# Patient Record
Sex: Female | Born: 1995
Health system: Southern US, Community
[De-identification: ages and names within clinical notes are randomized; demographics above are authoritative.]

## PROBLEM LIST (undated history)

## (undated) DIAGNOSIS — K6389 Other specified diseases of intestine: Secondary | ICD-10-CM

## (undated) DIAGNOSIS — M1990 Unspecified osteoarthritis, unspecified site: Secondary | ICD-10-CM

## (undated) HISTORY — PX: EYE MUSCLE SURGERY: SHX370

## (undated) HISTORY — PX: WISDOM TOOTH EXTRACTION: SHX21

---
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
## 2017-01-11 DIAGNOSIS — H5203 Hypermetropia, bilateral: Secondary | ICD-10-CM | POA: Insufficient documentation

## 2017-01-11 DIAGNOSIS — H52223 Regular astigmatism, bilateral: Secondary | ICD-10-CM

## 2017-01-26 DIAGNOSIS — H5043 Accommodative component in esotropia: Secondary | ICD-10-CM | POA: Diagnosis not present

## 2017-04-20 DIAGNOSIS — L700 Acne vulgaris: Secondary | ICD-10-CM | POA: Diagnosis not present

## 2018-01-06 DIAGNOSIS — R1084 Generalized abdominal pain: Secondary | ICD-10-CM | POA: Diagnosis not present

## 2018-01-06 DIAGNOSIS — A084 Viral intestinal infection, unspecified: Secondary | ICD-10-CM | POA: Diagnosis not present

## 2018-01-06 DIAGNOSIS — R112 Nausea with vomiting, unspecified: Secondary | ICD-10-CM | POA: Diagnosis not present

## 2018-07-25 DIAGNOSIS — H5203 Hypermetropia, bilateral: Secondary | ICD-10-CM | POA: Diagnosis not present

## 2018-10-12 ENCOUNTER — Ambulatory Visit (INDEPENDENT_AMBULATORY_CARE_PROVIDER_SITE_OTHER): Payer: Self-pay | Admitting: Family Medicine

## 2018-10-14 ENCOUNTER — Other Ambulatory Visit (INDEPENDENT_AMBULATORY_CARE_PROVIDER_SITE_OTHER): Payer: Self-pay

## 2018-10-14 ENCOUNTER — Other Ambulatory Visit (INDEPENDENT_AMBULATORY_CARE_PROVIDER_SITE_OTHER): Payer: Self-pay | Admitting: Family Medicine

## 2018-10-14 DIAGNOSIS — M25552 Pain in left hip: Secondary | ICD-10-CM

## 2018-10-14 MED ORDER — LORAZEPAM 0.5 MG PO TABS: 0.2500 mg | ORAL_TABLET | Freq: Every evening | ORAL | 0 refills | Status: DC | PRN

## 2018-10-14 MED ORDER — ACETAMINOPHEN-CODEINE #3 300-30 MG PO TABS: 1.0000 | ORAL_TABLET | ORAL | 0 refills | Status: DC | PRN

## 2018-10-15 ENCOUNTER — Encounter (INDEPENDENT_AMBULATORY_CARE_PROVIDER_SITE_OTHER): Payer: Self-pay | Admitting: Family Medicine

## 2018-10-15 NOTE — Progress Notes (Signed)
   Office Visit Note   Patient: Natalie Maldonado           Date of Birth: [birth year]           MRN: [id number] Visit Date: 10/12/2018 Requested by: Aggie Hacker, MD [address] [place], [place] [address] PCP: Aggie Hacker, MD  Subjective: No chief complaint on file.   HPI: She is here with left hip pain.  She has a history of inflammatory arthritic troubles a few years ago.  She briefly was treated with methotrexate.  She did well since then.  Recent flare-up of left groin/hip pain.  No fevers or chills.  Had flu shot about a month ago.  No unusual dietary changes.              ROS: Otherwise noncontributory.  Objective: Vital Signs: There were no vitals taken for this visit.  Physical Exam:  Walks with a limp.  Extreme pain with passive internal left hip rotation.    Imaging: Ultrasound shows a joint effusion with no hyperemia on power doppler.  Assessment & Plan: 1.  Left hip effusion - We will aspirate and analyze fluid. - Inject with small amount of steroid.   Follow-Up Instructions: No follow-ups on file.       Procedures: Ultrasound-guided left hip aspiration/injection:  After sterile prep, injected 5 cc marcaine superficially and toward the capsule but not into the joint, then aspirated 8 cc hazy yellow fluid using 18 gauge spinal needle, then injected 5 cc 1 % lidocaine intra-articularly.    PMFS History: Patient Active Problem List   Diagnosis Date Noted  . Granuloma of intestine 03/27/2015  . Inflammatory arthritis   . FUO (fever of unknown origin)   . Postoperative fever   . Perforated appendicitis 03/23/2015   Past Medical History:  Diagnosis Date  . Arthritis    joint swelling as a result of appenditis  . Granuloma of intestine 03/27/2015    No family history on file.  Past Surgical History:  Procedure Laterality Date  . EYE MUSCLE SURGERY     age 82  . HYSTEROSCOPY N/A 06/01/2016   Procedure: EXCISION OF HYMEN;  Surgeon: Fermin Schwab, MD;   Location: [facility];  Service: Gynecology;  Laterality: N/A;  . LAPAROSCOPIC APPENDECTOMY N/A 03/24/2015   Procedure: APPENDECTOMY LAPAROSCOPIC;  Surgeon: Abigail Miyamoto, MD;  Location: [facility] OR;  Service: General;  Laterality: N/A;  . WISDOM TOOTH EXTRACTION     Social History   Occupational History  . Not on file  Tobacco Use  . Smoking status: Never Smoker  Substance and Sexual Activity  . Alcohol use: Not on file  . Drug use: Not on file  . Sexual activity: Not on file

## 2018-10-18 DIAGNOSIS — H509 Unspecified strabismus: Secondary | ICD-10-CM | POA: Insufficient documentation

## 2018-10-18 DIAGNOSIS — Z0000 Encounter for general adult medical examination without abnormal findings: Secondary | ICD-10-CM | POA: Insufficient documentation

## 2018-10-19 DIAGNOSIS — K6389 Other specified diseases of intestine: Secondary | ICD-10-CM | POA: Diagnosis not present

## 2018-10-19 DIAGNOSIS — M1990 Unspecified osteoarthritis, unspecified site: Secondary | ICD-10-CM | POA: Diagnosis not present

## 2018-10-19 DIAGNOSIS — Z8719 Personal history of other diseases of the digestive system: Secondary | ICD-10-CM | POA: Diagnosis not present

## 2018-10-19 DIAGNOSIS — H509 Unspecified strabismus: Secondary | ICD-10-CM | POA: Diagnosis not present

## 2018-10-19 DIAGNOSIS — Z0000 Encounter for general adult medical examination without abnormal findings: Secondary | ICD-10-CM | POA: Diagnosis not present

## 2018-10-19 LAB — ANAEROBIC AND AEROBIC CULTURE
AER RESULT:: NO GROWTH
GRAM STAIN: NONE SEEN
MICRO NUMBER:: 91322865
MICRO NUMBER:: 91322866
SPECIMEN QUALITY: ADEQUATE
SPECIMEN QUALITY:: ADEQUATE

## 2018-10-19 LAB — SYNOVIAL CELL COUNT + DIFF, W/ CRYSTALS
Basophils, %: 0 %
EOSINOPHILS-SYNOVIAL: 0 % (ref 0–2)
Lymphocytes-Synovial Fld: 5 % (ref 0–74)
Monocyte/Macrophage: 28 % (ref 0–69)
NEUTROPHIL, SYNOVIAL: 67 % — AB (ref 0–24)
Synoviocytes, %: 0 % (ref 0–15)
WBC, SYNOVIAL: 13130 {cells}/uL — AB (ref <150)

## 2018-11-27 ENCOUNTER — Ambulatory Visit (INDEPENDENT_AMBULATORY_CARE_PROVIDER_SITE_OTHER): Payer: 59 | Admitting: Family Medicine

## 2018-11-27 ENCOUNTER — Encounter (INDEPENDENT_AMBULATORY_CARE_PROVIDER_SITE_OTHER): Payer: Self-pay | Admitting: Family Medicine

## 2018-11-27 DIAGNOSIS — M25552 Pain in left hip: Secondary | ICD-10-CM

## 2018-11-27 NOTE — Patient Instructions (Addendum)
   [email or web address]  [email or web address]

## 2018-11-27 NOTE — Progress Notes (Signed)
   Office Visit Note   Patient: Natalie Maldonado           Date of Birth: June 18, 1996           MRN: [id number] Visit Date: 11/27/2018 Requested by: Aggie HackerSumner, Brian, MD [address] [place], [place] [id number] PCP: Aggie HackerSumner, Brian, MD  Subjective: Chief Complaint  Patient presents with  . Left Hip - Pain    HPI: She is here with recurrent left hip pain.  Symptoms started a couple days ago, possibly after eating a meal at a restaurant.  She has been experimenting with an autoimmune protocol diet and thinks that it might be helping.  She is not sure what food might have triggered her current episode.  Symptoms are much better after taking Aleve.  She is getting ready to have some specialty labs drawn by a nutritionist tomorrow.  She wonders what else she might consider getting checked.              ROS: Noncontributory  Objective: Vital Signs: There were no vitals taken for this visit.  Physical Exam:  Left hip: Much better range of motion today with minimal pain on internal rotation.  Imaging: MSK-US: No significant joint effusion today in either hip.  No hyperemia on power Doppler imaging.  Assessment & Plan: 1.  Improved left hip pain status post recent flareup -We will add vitamin D level and TPO antibodies to her labs for tomorrow. -She also wants to investigate bio compatibility metal testing.  She plans to have food sensitivity testing tomorrow. -We will order MRI of the pelvis as well.   Follow-Up Instructions: No follow-ups on file.      Procedures: No procedures performed  No notes on file    PMFS History: Patient Active Problem List   Diagnosis Date Noted  . Granuloma of intestine 03/27/2015  . Inflammatory arthritis   . FUO (fever of unknown origin)   . Postoperative fever   . Perforated appendicitis 03/23/2015   Past Medical History:  Diagnosis Date  . Arthritis    joint swelling as a result of appenditis  . Granuloma of intestine 03/27/2015    History reviewed.  No pertinent family history.  Past Surgical History:  Procedure Laterality Date  . EYE MUSCLE SURGERY     age 27  . HYSTEROSCOPY N/A 06/01/2016   Procedure: EXCISION OF HYMEN;  Surgeon: Fermin Schwabamer Yalcinkaya, MD;  Location: [facility];  Service: Gynecology;  Laterality: N/A;  . LAPAROSCOPIC APPENDECTOMY N/A 03/24/2015   Procedure: APPENDECTOMY LAPAROSCOPIC;  Surgeon: Abigail Miyamotoouglas [name], MD;  Location: [facility] OR;  Service: General;  Laterality: N/A;  . WISDOM TOOTH EXTRACTION     Social History   Occupational History  . Not on file  Tobacco Use  . Smoking status: Never Smoker  Substance and Sexual Activity  . Alcohol use: Not on file  . Drug use: Not on file  . Sexual activity: Not on file

## 2018-12-07 ENCOUNTER — Ambulatory Visit
Admission: RE | Admit: 2018-12-07 | Discharge: 2018-12-07 | Disposition: A | Discharge status: Home / Self Care | Payer: 59 | Source: Ambulatory Visit | Attending: Family Medicine | Admitting: Family Medicine

## 2018-12-07 DIAGNOSIS — M25552 Pain in left hip: Secondary | ICD-10-CM

## 2018-12-07 DIAGNOSIS — S73192A Other sprain of left hip, initial encounter: Secondary | ICD-10-CM | POA: Diagnosis not present

## 2018-12-10 ENCOUNTER — Telehealth (INDEPENDENT_AMBULATORY_CARE_PROVIDER_SITE_OTHER): Payer: Self-pay | Admitting: Family Medicine

## 2018-12-10 NOTE — Telephone Encounter (Signed)
MRI shows acetabular labrum tear.

## 2018-12-14 ENCOUNTER — Other Ambulatory Visit (INDEPENDENT_AMBULATORY_CARE_PROVIDER_SITE_OTHER): Payer: Self-pay | Admitting: Family Medicine

## 2018-12-14 ENCOUNTER — Telehealth (INDEPENDENT_AMBULATORY_CARE_PROVIDER_SITE_OTHER): Payer: Self-pay | Admitting: Family Medicine

## 2018-12-14 ENCOUNTER — Ambulatory Visit (INDEPENDENT_AMBULATORY_CARE_PROVIDER_SITE_OTHER): Payer: Self-pay

## 2018-12-14 ENCOUNTER — Ambulatory Visit (INDEPENDENT_AMBULATORY_CARE_PROVIDER_SITE_OTHER): Payer: 59

## 2018-12-14 DIAGNOSIS — R768 Other specified abnormal immunological findings in serum: Secondary | ICD-10-CM

## 2018-12-14 NOTE — Telephone Encounter (Signed)
CXR looks good, no adenopathy.

## 2018-12-14 NOTE — Progress Notes (Signed)
The patient comes in today for chest xray - reviewed by Dr. Prince Rome.

## 2019-01-22 DIAGNOSIS — H01004 Unspecified blepharitis left upper eyelid: Secondary | ICD-10-CM | POA: Diagnosis not present

## 2019-01-22 DIAGNOSIS — H5789 Other specified disorders of eye and adnexa: Secondary | ICD-10-CM | POA: Diagnosis not present

## 2019-01-22 DIAGNOSIS — H538 Other visual disturbances: Secondary | ICD-10-CM | POA: Diagnosis not present

## 2019-03-01 ENCOUNTER — Other Ambulatory Visit (INDEPENDENT_AMBULATORY_CARE_PROVIDER_SITE_OTHER): Payer: Self-pay | Admitting: Family Medicine

## 2019-03-01 MED ORDER — METHYLPREDNISOLONE 4 MG PO TBPK
ORAL_TABLET | ORAL | 0 refills | Status: DC
Start: 2019-03-01 — End: 2019-03-01

## 2019-03-01 MED ORDER — METHYLPREDNISOLONE 4 MG PO TBPK: ORAL_TABLET | ORAL | 0 refills | Status: DC

## 2019-03-01 NOTE — Progress Notes (Signed)
.  ed

## 2019-04-19 ENCOUNTER — Other Ambulatory Visit: Payer: Self-pay

## 2019-04-19 ENCOUNTER — Ambulatory Visit: Payer: 59 | Admitting: Family Medicine

## 2019-04-19 ENCOUNTER — Encounter: Payer: Self-pay | Admitting: Family Medicine

## 2019-04-19 DIAGNOSIS — R5383 Other fatigue: Secondary | ICD-10-CM | POA: Diagnosis not present

## 2019-04-19 NOTE — Progress Notes (Signed)
Office Visit Note   Patient: Natalie Maldonado           Date of Birth: 25-Feb-1996           MRN: [id number] Visit Date: 04/19/2019 Requested by: Aggie HackerSumner, Brian, MD [address] [place], [place] [id number] PCP: Aggie HackerSumner, Brian, MD  Subjective: Chief Complaint  Patient presents with  . thyroid check    HPI: She is here with fatigue.  For the past couple months she has been intermittently excessively tired.  She sleeps well and gets enough sleep, but despite that she has some days where she is very sluggish and "foggy".  She has also noticed that she is losing hair more readily lately.  Denies any change in her weight, no bowel or bladder change, she chronically has abnormal menstrual cycles.  Denies any brittle nails or unusually dry skin.  No definite family history of thyroid dysfunction.  She continues to have intermittent pain in her hip.               ROS:  All other systems were reviewed and are negative.  Objective: Vital Signs: There were no vitals taken for this visit.  Physical Exam:  General:  Alert and oriented, in no acute distress. Pulm:  Breathing unlabored. Psy:  Normal mood, congruent affect. Skin: No visible rash today. HEENT:  Schlusser/AT, PERRLA, EOM Full, no nystagmus.  Funduscopic examination within normal limits.  No conjunctival erythema.  Tympanic membranes are pearly gray with normal landmarks.  External ear canals are normal.  Nasal passages are clear.  Oropharynx is clear.  No significant lymphadenopathy.  No thyromegaly or nodules.  2+ carotid pulses without bruits. Extremities: 2+ radial and posterior tibial pulses.  2+ upper and lower extremity DTRs.  No nail deformities.   Imaging: None today.  Assessment & Plan: 1.  Fatigue and excess hair loss, etiology uncertain.  Cannot rule out thyroid dysfunction. -We will draw thyroid labs including antibodies.     Procedures: No procedures performed  No notes on file     PMFS History: Patient Active Problem List    Diagnosis Date Noted  . Annual physical exam 10/18/2018  . Strabismus 10/18/2018  . Hyperopia of both eyes with regular astigmatism 01/11/2017  . Accommodative esotropia 11/30/2015  . Hypertropia of left eye 11/30/2015  . Intermittent alternating exotropia 11/30/2015  . Other chronic allergic conjunctivitis 11/30/2015  . Status post eye surgery 11/30/2015  . 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 History of appendicitis 05/25/2015  . Granuloma of intestine 03/27/2015  . Inflammatory arthritis   . FUO (fever of unknown origin)   . Postoperative fever   . Perforated appendicitis 03/23/2015  . Transient arthropathy 02/09/2015   Past Medical History:  Diagnosis Date  . Arthritis    joint swelling as a result of appenditis  . Granuloma of intestine 03/27/2015    History reviewed. No pertinent family history.  Past Surgical History:  Procedure Laterality Date  . EYE MUSCLE SURGERY     age 32  . HYSTEROSCOPY N/A 06/01/2016   Procedure: EXCISION OF HYMEN;  Surgeon: Fermin Schwabamer Yalcinkaya, MD;  Location: [facility];  Service: Gynecology;  Laterality: N/A;  . LAPAROSCOPIC APPENDECTOMY N/A 03/24/2015   Procedure: APPENDECTOMY LAPAROSCOPIC;  Surgeon: Abigail Miyamotoouglas [name], MD;  Location: [facility];  Service: General;  Laterality: N/A;  . WISDOM TOOTH EXTRACTION     Social History   Occupational History  . Not on file  Tobacco Use  . Smoking status: Never  Smoker  Substance and Sexual Activity  . Alcohol use: Not on file  . Drug use: Not on file  . Sexual activity: Not on file

## 2019-04-20 ENCOUNTER — Telehealth: Payer: Self-pay | Admitting: Family Medicine

## 2019-04-20 NOTE — Telephone Encounter (Signed)
TSH in normal range at 3.69, but higher than I'd like to see at her age (target would be 0.5-1.0).    Antibodies still pending.

## 2019-04-22 LAB — THYROID PEROXIDASE ANTIBODY: Thyroperoxidase Ab SerPl-aCnc: 1 IU/mL (ref <9)

## 2019-04-22 LAB — THYROID PANEL WITH TSH
Free Thyroxine Index: 2.1 (ref 1.4–3.8)
T3 Uptake: 28 % (ref 22–35)
T4, Total: 7.5 ug/dL (ref 5.1–11.9)
TSH: 3.69 mIU/L

## 2019-04-24 DIAGNOSIS — M2550 Pain in unspecified joint: Secondary | ICD-10-CM | POA: Diagnosis not present

## 2019-04-24 DIAGNOSIS — E663 Overweight: Secondary | ICD-10-CM | POA: Diagnosis not present

## 2019-04-24 DIAGNOSIS — Z6825 Body mass index (BMI) 25.0-25.9, adult: Secondary | ICD-10-CM | POA: Diagnosis not present

## 2019-05-02 DIAGNOSIS — M2550 Pain in unspecified joint: Secondary | ICD-10-CM | POA: Diagnosis not present

## 2019-05-03 MED FILL — sulfaSALAzine 500 MG TABS: 500 | 30 days supply | Qty: 60 | Fill #0

## 2019-05-14 DIAGNOSIS — L568 Other specified acute skin changes due to ultraviolet radiation: Secondary | ICD-10-CM | POA: Diagnosis not present

## 2019-05-16 MED FILL — PIMECROLIMUS 1 % CREA: 1 | 30 days supply | Qty: 30 | Fill #0

## 2019-05-22 DIAGNOSIS — L560 Drug phototoxic response: Secondary | ICD-10-CM | POA: Diagnosis not present

## 2019-05-22 DIAGNOSIS — B078 Other viral warts: Secondary | ICD-10-CM | POA: Diagnosis not present

## 2019-05-24 DIAGNOSIS — M0800 Unspecified juvenile rheumatoid arthritis of unspecified site: Secondary | ICD-10-CM | POA: Diagnosis not present

## 2019-05-24 DIAGNOSIS — H5203 Hypermetropia, bilateral: Secondary | ICD-10-CM | POA: Diagnosis not present

## 2019-05-24 DIAGNOSIS — H538 Other visual disturbances: Secondary | ICD-10-CM | POA: Diagnosis not present

## 2019-05-29 DIAGNOSIS — Z01419 Encounter for gynecological examination (general) (routine) without abnormal findings: Secondary | ICD-10-CM | POA: Diagnosis not present

## 2019-05-29 DIAGNOSIS — Z6824 Body mass index (BMI) 24.0-24.9, adult: Secondary | ICD-10-CM | POA: Diagnosis not present

## 2019-06-03 DIAGNOSIS — M2550 Pain in unspecified joint: Secondary | ICD-10-CM | POA: Diagnosis not present

## 2019-06-03 DIAGNOSIS — Z6824 Body mass index (BMI) 24.0-24.9, adult: Secondary | ICD-10-CM | POA: Diagnosis not present

## 2019-06-03 MED FILL — predniSONE 5 MG TABS: 5 | 12 days supply | Qty: 48 | Fill #0

## 2019-06-03 MED FILL — sulfaSALAzine 500 MG TABS: 500 | 30 days supply | Qty: 60 | Fill #1

## 2019-07-08 MED FILL — sulfaSALAzine 500 MG TABS: 500 | 30 days supply | Qty: 60 | Fill #2

## 2019-07-18 DIAGNOSIS — N946 Dysmenorrhea, unspecified: Secondary | ICD-10-CM | POA: Diagnosis not present

## 2019-07-18 DIAGNOSIS — Z30011 Encounter for initial prescription of contraceptive pills: Secondary | ICD-10-CM | POA: Diagnosis not present

## 2019-07-18 DIAGNOSIS — N920 Excessive and frequent menstruation with regular cycle: Secondary | ICD-10-CM | POA: Diagnosis not present

## 2019-07-26 DIAGNOSIS — Z6824 Body mass index (BMI) 24.0-24.9, adult: Secondary | ICD-10-CM | POA: Diagnosis not present

## 2019-07-26 DIAGNOSIS — M2550 Pain in unspecified joint: Secondary | ICD-10-CM | POA: Diagnosis not present

## 2019-10-13 DIAGNOSIS — Z23 Encounter for immunization: Secondary | ICD-10-CM | POA: Diagnosis not present

## 2019-11-12 DIAGNOSIS — M2550 Pain in unspecified joint: Secondary | ICD-10-CM | POA: Diagnosis not present

## 2019-11-12 DIAGNOSIS — E663 Overweight: Secondary | ICD-10-CM | POA: Diagnosis not present

## 2019-11-12 DIAGNOSIS — Z6825 Body mass index (BMI) 25.0-25.9, adult: Secondary | ICD-10-CM | POA: Diagnosis not present

## 2019-11-24 DIAGNOSIS — Z20828 Contact with and (suspected) exposure to other viral communicable diseases: Secondary | ICD-10-CM | POA: Diagnosis not present

## 2019-12-12 DIAGNOSIS — Z20828 Contact with and (suspected) exposure to other viral communicable diseases: Secondary | ICD-10-CM | POA: Diagnosis not present

## 2020-03-05 DIAGNOSIS — Z23 Encounter for immunization: Secondary | ICD-10-CM | POA: Diagnosis not present

## 2020-05-06 DIAGNOSIS — E663 Overweight: Secondary | ICD-10-CM | POA: Diagnosis not present

## 2020-05-06 DIAGNOSIS — M2550 Pain in unspecified joint: Secondary | ICD-10-CM | POA: Diagnosis not present

## 2020-05-06 DIAGNOSIS — Z6825 Body mass index (BMI) 25.0-25.9, adult: Secondary | ICD-10-CM | POA: Diagnosis not present

## 2020-06-01 DIAGNOSIS — Z6825 Body mass index (BMI) 25.0-25.9, adult: Secondary | ICD-10-CM | POA: Diagnosis not present

## 2020-06-01 DIAGNOSIS — Z01419 Encounter for gynecological examination (general) (routine) without abnormal findings: Secondary | ICD-10-CM | POA: Diagnosis not present

## 2020-06-02 IMAGING — MR MR PELVIS W/O CM
4 of 6 series · 31 of 48 positions shown · non-contrast
Comparison: CT abdomen and pelvis 03/23/2015.

Addendum:
CLINICAL DATA: Severe left hip pain.  No known injury..

EXAM:
MR OF THE LEFT HIP WITHOUT CONTRAST
TECHNIQUE: Multiplanar, multisequence MR imaging was performed. No intravenous
contrast was administered.

[Series 2: T2 fat-sat · sagittal · 4.0mm · 0.49mm/px · 11 of 65 slices shown (1 of 3)]
[im 1/65]
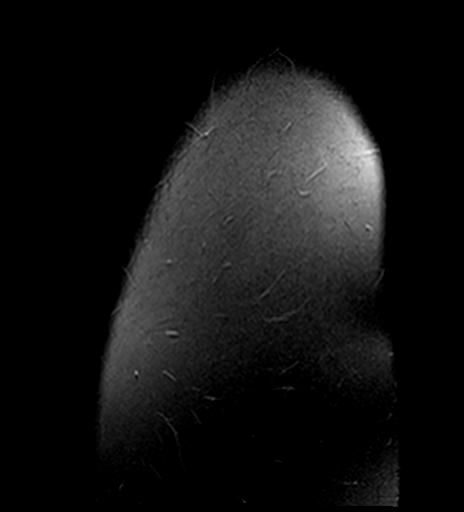
[im 7/65]
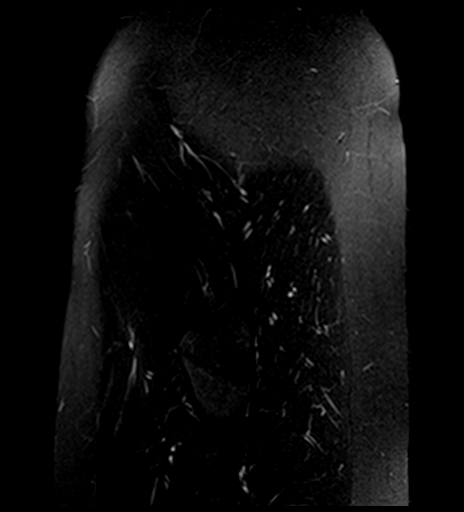
[im 13/65]
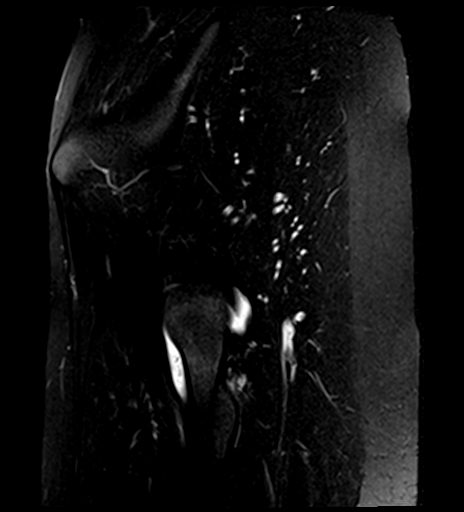
[im 20/65]
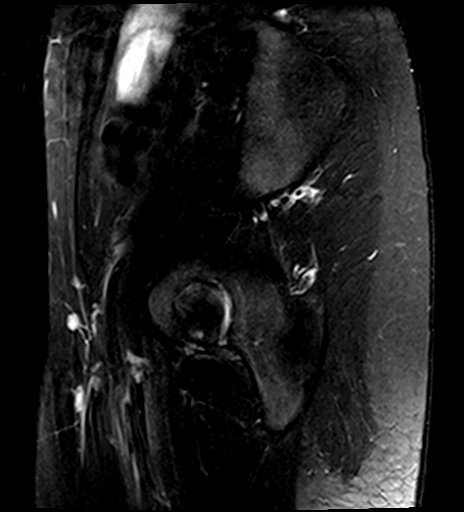
[im 26/65]
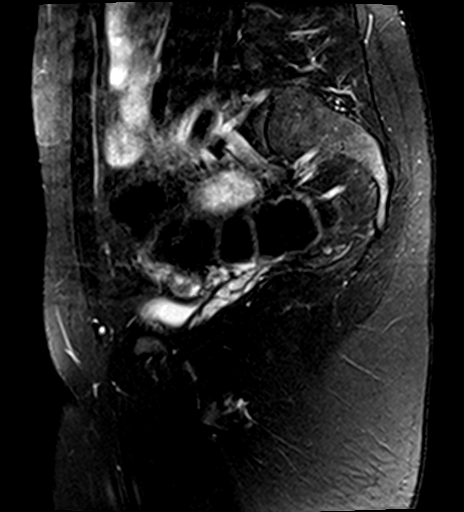
[im 33/65]
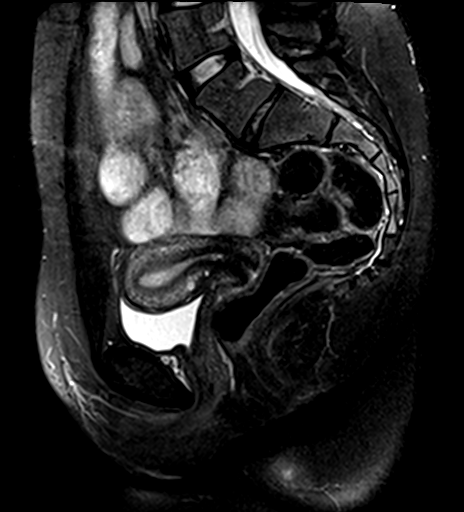
[im 39/65]
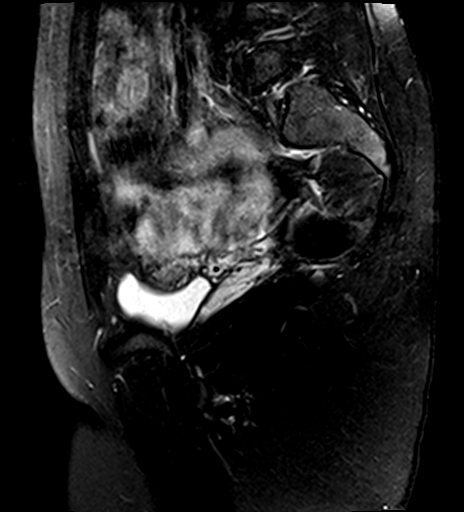
[im 45/65]
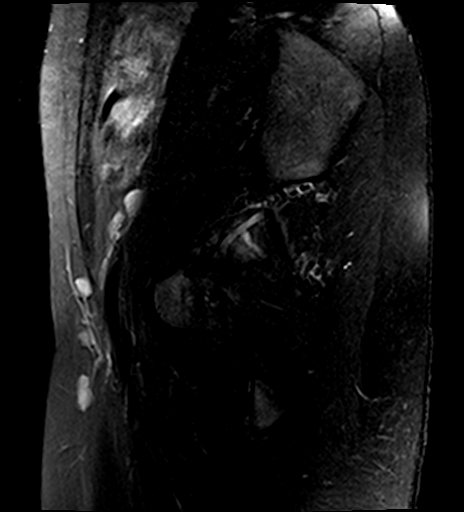
[im 52/65]
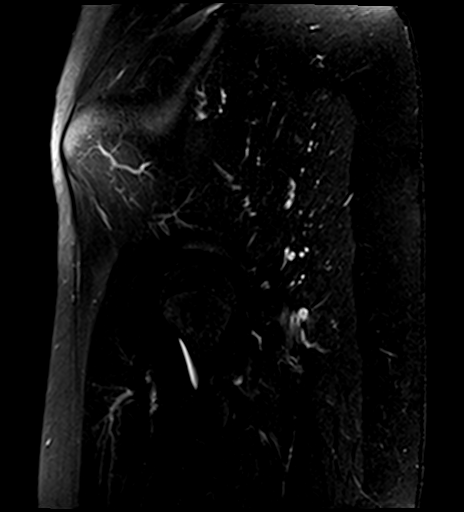
[im 58/65]
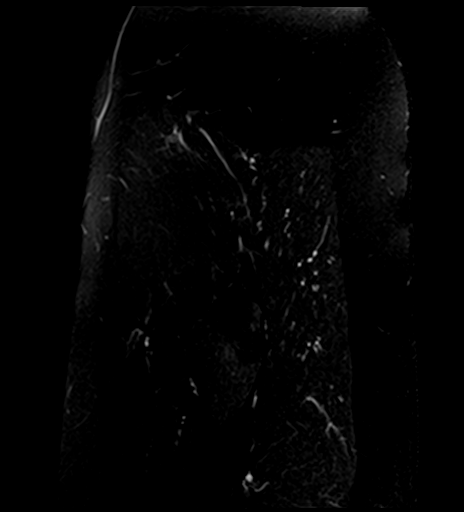
[im 65/65]
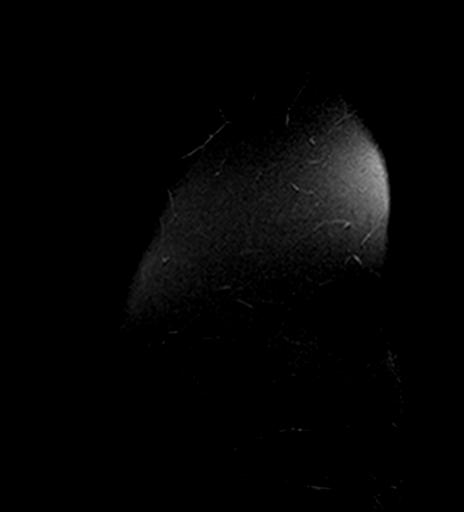

[Series 3: T1 · coronal · 4.0mm · 1.33mm/px · 8 of 52 slices shown]
[im 1/52]
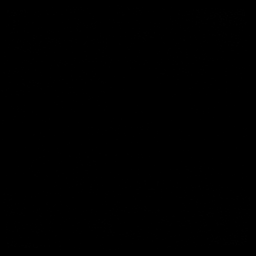
[im 6/52]
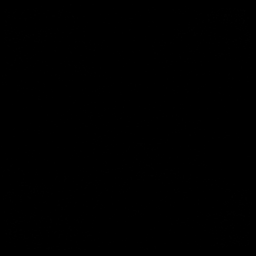
[im 18/52]
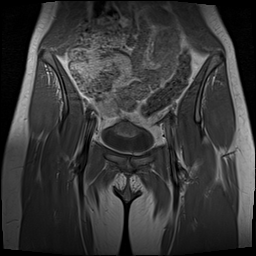
[im 23/52]
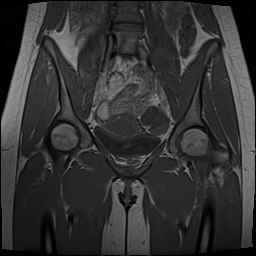
[im 29/52]
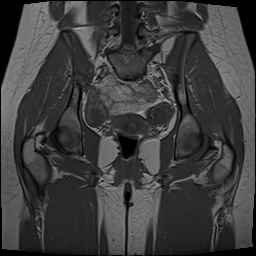
[im 35/52]
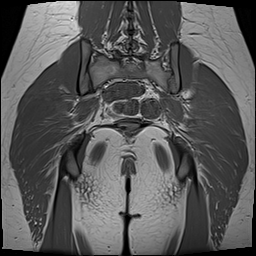
[im 46/52]
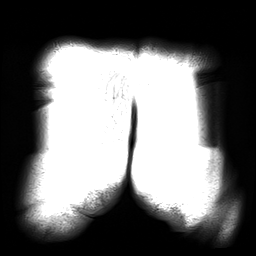
[im 52/52]
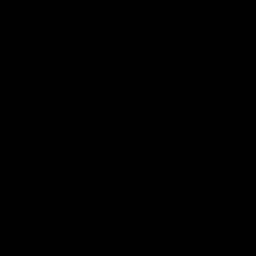

[Series 4: T2 fat-sat · coronal · 4.0mm · 1.19mm/px · 8 of 53 slices shown (2 of 3)]
[im 1/53]
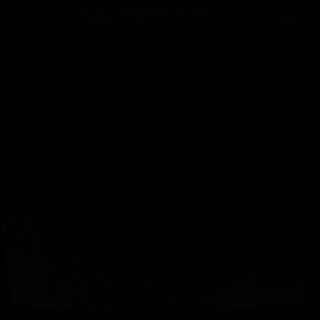
[im 6/53]
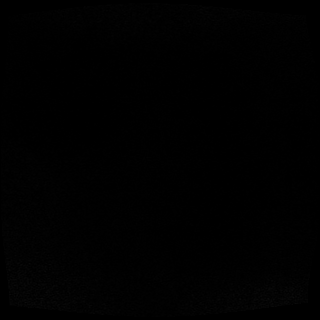
[im 18/53]
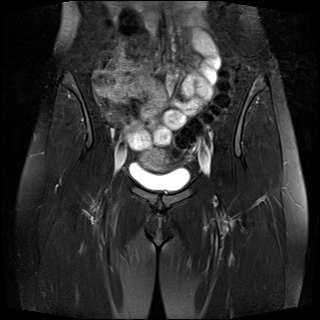
[im 24/53]
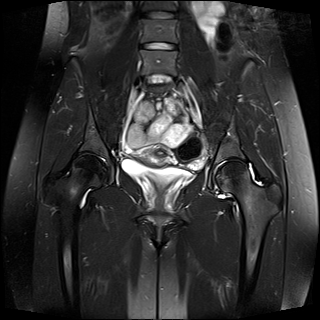
[im 29/53]
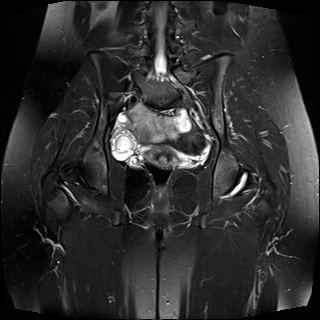
[im 35/53]
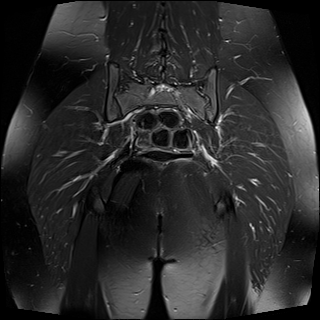
[im 47/53]
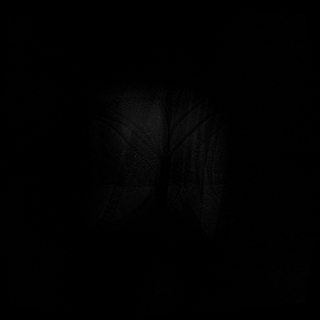
[im 53/53]
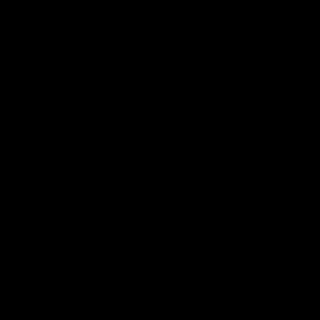

[Series 5: T2 fat-sat · axial · 4.0mm · 0.35mm/px · z∈[-134,+31]mm · 4 of 34 slices shown (3 of 3)]
[im 1/34]
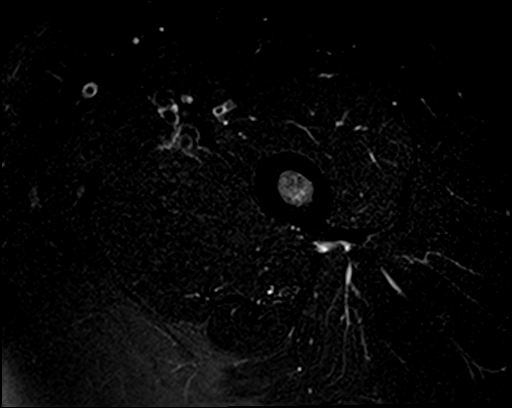
[im 7/34]
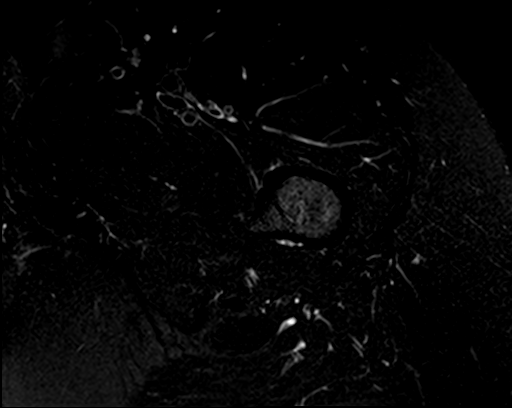
[im 20/34]
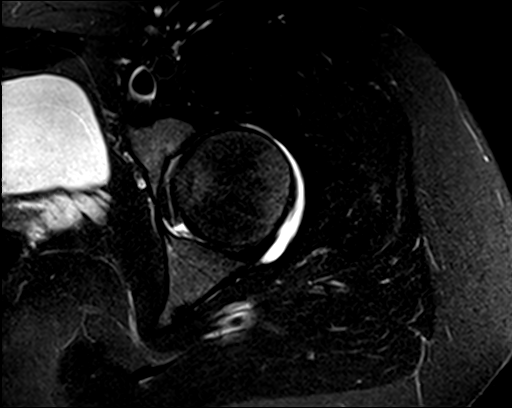
[im 34/34]
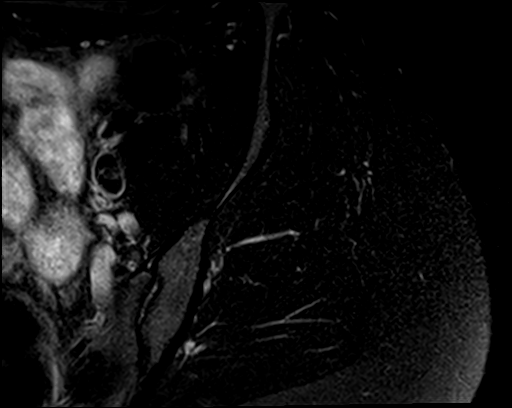

[31 of 48 positions shown; findings below may reference images not displayed]

FINDINGS: Bones: Marrow signal is normal without fracture, stress change or
focal lesion. No avascular necrosis of the femoral heads. No
subchondral cyst formation or edema about the hips.

Articular cartilage and labrum

Articular cartilage:  Preserved.

Labrum:  There is a tear of the anterior, superior left labrum.

Joint or bursal effusion

Joint effusion:  Small to moderate left hip joint effusion.

Bursae: Negative.

Muscles and tendons

Muscles and tendons:  Intact and normal in appearance.

Other findings

Miscellaneous:   Imaged intrapelvic contents are unremarkable.
IMPRESSION: The examination is positive for a tear of the anterior, superior
left acetabular labrum.

Small to moderate left hip joint effusion.

ADDENDUM:
This addendum is given for the purpose of noting that the described
defect in the anterior, superior labrum is smoothly marginated and
could also represent a cleft in the labrum rather than a tear. Based
on review of the case, cleft is favored over a tear.

*** End of Addendum ***

## 2020-07-17 DIAGNOSIS — Z111 Encounter for screening for respiratory tuberculosis: Secondary | ICD-10-CM | POA: Diagnosis not present

## 2020-07-19 DIAGNOSIS — Z111 Encounter for screening for respiratory tuberculosis: Secondary | ICD-10-CM | POA: Diagnosis not present

## 2020-08-14 DIAGNOSIS — H5043 Accommodative component in esotropia: Secondary | ICD-10-CM | POA: Diagnosis not present

## 2020-08-14 DIAGNOSIS — H5022 Vertical strabismus, left eye: Secondary | ICD-10-CM | POA: Diagnosis not present

## 2020-08-14 DIAGNOSIS — H5034 Intermittent alternating exotropia: Secondary | ICD-10-CM | POA: Diagnosis not present

## 2020-12-07 DIAGNOSIS — R3915 Urgency of urination: Secondary | ICD-10-CM | POA: Diagnosis not present

## 2020-12-08 DIAGNOSIS — H5712 Ocular pain, left eye: Secondary | ICD-10-CM | POA: Diagnosis not present

## 2020-12-08 DIAGNOSIS — H5022 Vertical strabismus, left eye: Secondary | ICD-10-CM | POA: Diagnosis not present

## 2020-12-08 DIAGNOSIS — H5043 Accommodative component in esotropia: Secondary | ICD-10-CM | POA: Diagnosis not present

## 2020-12-08 DIAGNOSIS — H5702 Anisocoria: Secondary | ICD-10-CM | POA: Diagnosis not present

## 2021-01-15 DIAGNOSIS — E663 Overweight: Secondary | ICD-10-CM | POA: Diagnosis not present

## 2021-01-15 DIAGNOSIS — M2550 Pain in unspecified joint: Secondary | ICD-10-CM | POA: Diagnosis not present

## 2021-01-15 DIAGNOSIS — Z6826 Body mass index (BMI) 26.0-26.9, adult: Secondary | ICD-10-CM | POA: Diagnosis not present

## 2021-06-24 ENCOUNTER — Other Ambulatory Visit (HOSPITAL_COMMUNITY): Payer: Self-pay

## 2021-06-28 DIAGNOSIS — Z20822 Contact with and (suspected) exposure to covid-19: Secondary | ICD-10-CM | POA: Diagnosis not present

## 2021-07-03 DIAGNOSIS — R112 Nausea with vomiting, unspecified: Secondary | ICD-10-CM | POA: Diagnosis not present

## 2021-07-03 DIAGNOSIS — R9431 Abnormal electrocardiogram [ECG] [EKG]: Secondary | ICD-10-CM | POA: Diagnosis not present

## 2021-07-03 DIAGNOSIS — Z8616 Personal history of COVID-19: Secondary | ICD-10-CM | POA: Diagnosis not present

## 2021-07-03 DIAGNOSIS — R197 Diarrhea, unspecified: Secondary | ICD-10-CM | POA: Diagnosis not present

## 2021-07-03 DIAGNOSIS — R1013 Epigastric pain: Secondary | ICD-10-CM | POA: Diagnosis not present

## 2021-08-27 DIAGNOSIS — M1990 Unspecified osteoarthritis, unspecified site: Secondary | ICD-10-CM | POA: Diagnosis not present

## 2021-08-27 DIAGNOSIS — M2550 Pain in unspecified joint: Secondary | ICD-10-CM | POA: Diagnosis not present

## 2021-08-27 DIAGNOSIS — Z6830 Body mass index (BMI) 30.0-30.9, adult: Secondary | ICD-10-CM | POA: Diagnosis not present

## 2021-08-27 DIAGNOSIS — E669 Obesity, unspecified: Secondary | ICD-10-CM | POA: Diagnosis not present

## 2021-12-08 DIAGNOSIS — R5383 Other fatigue: Secondary | ICD-10-CM | POA: Diagnosis not present

## 2022-01-10 DIAGNOSIS — Z20822 Contact with and (suspected) exposure to covid-19: Secondary | ICD-10-CM | POA: Diagnosis not present

## 2022-01-11 DIAGNOSIS — J029 Acute pharyngitis, unspecified: Secondary | ICD-10-CM | POA: Diagnosis not present

## 2022-01-21 DIAGNOSIS — Z6831 Body mass index (BMI) 31.0-31.9, adult: Secondary | ICD-10-CM | POA: Diagnosis not present

## 2022-01-21 DIAGNOSIS — Z01419 Encounter for gynecological examination (general) (routine) without abnormal findings: Secondary | ICD-10-CM | POA: Diagnosis not present

## 2022-01-21 DIAGNOSIS — Z124 Encounter for screening for malignant neoplasm of cervix: Secondary | ICD-10-CM | POA: Diagnosis not present

## 2022-01-21 DIAGNOSIS — Z1151 Encounter for screening for human papillomavirus (HPV): Secondary | ICD-10-CM | POA: Diagnosis not present

## 2022-02-04 DIAGNOSIS — Z0000 Encounter for general adult medical examination without abnormal findings: Secondary | ICD-10-CM | POA: Diagnosis not present

## 2022-02-04 DIAGNOSIS — R635 Abnormal weight gain: Secondary | ICD-10-CM | POA: Diagnosis not present

## 2022-02-04 DIAGNOSIS — E559 Vitamin D deficiency, unspecified: Secondary | ICD-10-CM | POA: Diagnosis not present

## 2022-02-04 DIAGNOSIS — E538 Deficiency of other specified B group vitamins: Secondary | ICD-10-CM | POA: Diagnosis not present

## 2022-02-04 DIAGNOSIS — E039 Hypothyroidism, unspecified: Secondary | ICD-10-CM | POA: Diagnosis not present

## 2022-02-10 DIAGNOSIS — Z0000 Encounter for general adult medical examination without abnormal findings: Secondary | ICD-10-CM | POA: Diagnosis not present

## 2022-02-10 DIAGNOSIS — Z1331 Encounter for screening for depression: Secondary | ICD-10-CM | POA: Diagnosis not present

## 2022-02-10 DIAGNOSIS — Z8719 Personal history of other diseases of the digestive system: Secondary | ICD-10-CM | POA: Diagnosis not present

## 2022-02-10 DIAGNOSIS — E039 Hypothyroidism, unspecified: Secondary | ICD-10-CM | POA: Diagnosis not present

## 2022-02-10 DIAGNOSIS — E559 Vitamin D deficiency, unspecified: Secondary | ICD-10-CM | POA: Diagnosis not present

## 2022-02-10 DIAGNOSIS — M1990 Unspecified osteoarthritis, unspecified site: Secondary | ICD-10-CM | POA: Diagnosis not present

## 2022-02-10 DIAGNOSIS — E538 Deficiency of other specified B group vitamins: Secondary | ICD-10-CM | POA: Diagnosis not present

## 2022-02-10 DIAGNOSIS — R635 Abnormal weight gain: Secondary | ICD-10-CM | POA: Diagnosis not present

## 2022-02-10 DIAGNOSIS — H509 Unspecified strabismus: Secondary | ICD-10-CM | POA: Diagnosis not present

## 2022-02-22 DIAGNOSIS — E2740 Unspecified adrenocortical insufficiency: Secondary | ICD-10-CM | POA: Diagnosis not present

## 2022-02-22 DIAGNOSIS — E310 Autoimmune polyglandular failure: Secondary | ICD-10-CM | POA: Diagnosis not present

## 2022-02-22 DIAGNOSIS — M2550 Pain in unspecified joint: Secondary | ICD-10-CM | POA: Diagnosis not present

## 2022-02-22 DIAGNOSIS — Z1159 Encounter for screening for other viral diseases: Secondary | ICD-10-CM | POA: Diagnosis not present

## 2022-02-25 DIAGNOSIS — Z6831 Body mass index (BMI) 31.0-31.9, adult: Secondary | ICD-10-CM | POA: Diagnosis not present

## 2022-02-25 DIAGNOSIS — E063 Autoimmune thyroiditis: Secondary | ICD-10-CM | POA: Diagnosis not present

## 2022-02-25 DIAGNOSIS — E669 Obesity, unspecified: Secondary | ICD-10-CM | POA: Diagnosis not present

## 2022-02-25 DIAGNOSIS — M064 Inflammatory polyarthropathy: Secondary | ICD-10-CM | POA: Diagnosis not present

## 2022-03-17 DIAGNOSIS — E039 Hypothyroidism, unspecified: Secondary | ICD-10-CM | POA: Diagnosis not present

## 2022-03-17 DIAGNOSIS — Z79890 Hormone replacement therapy (postmenopausal): Secondary | ICD-10-CM | POA: Diagnosis not present

## 2022-04-18 DIAGNOSIS — E039 Hypothyroidism, unspecified: Secondary | ICD-10-CM | POA: Diagnosis not present

## 2022-04-18 DIAGNOSIS — R221 Localized swelling, mass and lump, neck: Secondary | ICD-10-CM | POA: Diagnosis not present

## 2022-05-02 DIAGNOSIS — M064 Inflammatory polyarthropathy: Secondary | ICD-10-CM | POA: Diagnosis not present

## 2022-05-02 DIAGNOSIS — E063 Autoimmune thyroiditis: Secondary | ICD-10-CM | POA: Diagnosis not present

## 2022-05-02 DIAGNOSIS — M25552 Pain in left hip: Secondary | ICD-10-CM | POA: Diagnosis not present

## 2022-05-02 DIAGNOSIS — Z6831 Body mass index (BMI) 31.0-31.9, adult: Secondary | ICD-10-CM | POA: Diagnosis not present

## 2022-05-02 DIAGNOSIS — E538 Deficiency of other specified B group vitamins: Secondary | ICD-10-CM | POA: Diagnosis not present

## 2022-05-02 DIAGNOSIS — E669 Obesity, unspecified: Secondary | ICD-10-CM | POA: Diagnosis not present

## 2022-05-02 DIAGNOSIS — R6889 Other general symptoms and signs: Secondary | ICD-10-CM | POA: Diagnosis not present

## 2022-05-02 DIAGNOSIS — E559 Vitamin D deficiency, unspecified: Secondary | ICD-10-CM | POA: Diagnosis not present

## 2022-05-03 ENCOUNTER — Encounter: Payer: Self-pay | Admitting: Physical Medicine and Rehabilitation

## 2022-05-03 ENCOUNTER — Ambulatory Visit: Payer: 59 | Admitting: Physical Medicine and Rehabilitation

## 2022-05-03 ENCOUNTER — Ambulatory Visit: Payer: Self-pay

## 2022-05-03 DIAGNOSIS — M25552 Pain in left hip: Secondary | ICD-10-CM

## 2022-05-03 NOTE — Progress Notes (Unsigned)
Pt state left hip pain. Pt state walking and standing makes the pain worse. Pt state she takes over the counter pain meds to help ease his pain.  Numeric Pain Rating Scale and Functional Assessment Average Pain 2   In the last MONTH (on 0-10 scale) has pain interfered with the following?  1. General activity like being  able to carry out your everyday physical activities such as walking, climbing stairs, carrying groceries, or moving a chair?  Rating(6)   -BT, -Dye Allergies.

## 2022-05-03 NOTE — Progress Notes (Unsigned)
   Natalie Maldonado - 26 y.o. female MRN [id number]  Date of birth: [birth year]  Office Visit Note: Visit Date: 05/03/2022 PCP: Aggie Hacker, MD Referred by: Aggie Hacker, MD  Subjective: Chief Complaint  Patient presents with   Left Hip - Pain   HPI:  Natalie Maldonado is a 26 y.o. female who comes in todayHPI ROS Otherwise per HPI.  Assessment & Plan: Visit Diagnoses:    ICD-10-CM   1. Pain in left hip  M25.552 XR C-ARM NO REPORT      Plan: No additional findings.   Meds & Orders: No orders of the defined types were placed in this encounter.   Orders Placed This Encounter  Procedures   XR C-ARM NO REPORT    Follow-up: No follow-ups on file.   Procedures: Large Joint Inj: L hip joint on 05/03/2022 8:19 AM Indications: diagnostic evaluation and pain Details: 22 G 3.5 in needle, fluoroscopy-guided anterior approach  Arthrogram: No  Medications: 4 mL bupivacaine 0.25 %; 60 mg triamcinolone acetonide 40 MG/ML Outcome: tolerated well, no immediate complications  There was excellent flow of contrast producing a partial arthrogram of the hip. The patient did have relief of symptoms during the anesthetic phase of the injection. Procedure, treatment alternatives, risks and benefits explained, specific risks discussed. Consent was given by the patient. Immediately prior to procedure a time out was called to verify the correct patient, procedure, equipment, support staff and site/side marked as required. Patient was prepped and draped in the usual sterile fashion.         Clinical History: No specialty comments available.     Objective:  VS:  HT:    WT:   BMI:     BP:   HR: bpm  TEMP: ( )  RESP:  Physical Exam   Imaging: No results found.

## 2022-05-04 MED ORDER — BUPIVACAINE HCL 0.25 % IJ SOLN
4.0000 mL | INTRAMUSCULAR | Status: AC | PRN
  Administered 2022-05-03: 4 mL via INTRA_ARTICULAR

## 2022-05-04 MED ORDER — TRIAMCINOLONE ACETONIDE 40 MG/ML IJ SUSP
60.0000 mg | INTRAMUSCULAR | Status: AC | PRN
  Administered 2022-05-03: 60 mg via INTRA_ARTICULAR

## 2022-05-09 LAB — ANAEROBIC AND AEROBIC CULTURE
AER RESULT:: NO GROWTH
MICRO NUMBER:: 13433604
MICRO NUMBER:: 13433605
SPECIMEN QUALITY:: ADEQUATE
SPECIMEN QUALITY:: ADEQUATE

## 2022-05-09 LAB — SYNOVIAL FLUID ANALYSIS, COMPLETE
Basophils, %: 0 %
Eosinophils-Synovial: 0 % (ref 0–2)
Lymphocytes-Synovial Fld: 3 % (ref 0–74)
Monocyte/Macrophage: 78 % — ABNORMAL HIGH (ref 0–69)
Neutrophil, Synovial: 19 % (ref 0–24)
Synoviocytes, %: 0 % (ref 0–15)
WBC, Synovial: 1088 cells/uL — ABNORMAL HIGH (ref <150)

## 2022-06-17 ENCOUNTER — Ambulatory Visit (INDEPENDENT_AMBULATORY_CARE_PROVIDER_SITE_OTHER): Payer: 59

## 2022-06-17 ENCOUNTER — Ambulatory Visit: Payer: 59 | Admitting: Surgical

## 2022-06-17 DIAGNOSIS — M25571 Pain in right ankle and joints of right foot: Secondary | ICD-10-CM | POA: Diagnosis not present

## 2022-06-19 ENCOUNTER — Encounter: Payer: Self-pay | Admitting: Surgical

## 2022-06-19 NOTE — Progress Notes (Signed)
Office Visit Note   Patient: Natalie Maldonado           Date of Birth: 1996/06/14           MRN: [id number] Visit Date: 06/17/2022 Requested by: Aggie Hacker, MD [address] [place],  [place] [address] PCP: Aggie Hacker, MD  Subjective: Chief Complaint  Patient presents with   Right Ankle - Pain    HPI: Natalie Maldonado is a 26 y.o. female who presents to the office complaining of right ankle injury.  Patient was walking with her mother earlier today when she sustained an inversion injury to the right ankle.  States that she was walking on flat ground.  Does not really have any significant pain following the injury but she does note weakness of the ankle.  She has had multiple ankle sprains in the past.  No real swelling or bruising that she has noticed.  Has never had surgery on her ankle.  Does have history of intermittent inflammatory arthropathy but no current flareups.  Overall she feels well but would like to strengthen her ankle is much as possible prior to starting medical school in the future..                ROS: All systems reviewed are negative as they relate to the chief complaint within the history of present illness.  Patient denies fevers or chills.  Assessment & Plan: Visit Diagnoses:  1. Pain in right ankle and joints of right foot     Plan: Patient is a 26 year old female who presents following right ankle inversion injury sustained earlier today.  No significant swelling noted on exam she has mild tenderness over the medial malleolus but otherwise exam is normal.  Radiographs are negative for any fracture or significant degenerative changes.  After discussion of options, with her history of multiple ankle sprains and the subjective weakness she feels in the ankle, she would like to try physical therapy.  Plan to refer her to PT upstairs to go 3 times per week.  Follow-up as needed if her symptoms do not improve.  Follow-Up Instructions: No follow-ups on file.   Orders:  Orders  Placed This Encounter  Procedures   XR Ankle Complete Right   XR Foot Complete Right   Ambulatory referral to Physical Therapy   No orders of the defined types were placed in this encounter.     Procedures: No procedures performed   Clinical Data: No additional findings.  Objective: Vital Signs: There were no vitals taken for this visit.  Physical Exam:  Constitutional: Patient appears well-developed HEENT:  Head: Normocephalic Eyes:EOM are normal Neck: Normal range of motion Cardiovascular: Normal rate Pulmonary/chest: Effort normal Neurologic: Patient is alert Skin: Skin is warm Psychiatric: Patient has normal mood and affect  Ortho Exam: Ortho exam demonstrates right ankle with 2+ DP pulse.  Intact ankle dorsiflexion, plantarflexion, inversion, eversion rated 5/5.  No tenderness over the lateral malleolus, Lisfranc complex, fifth metatarsal base, Achilles tendon, Achilles tendon insertion, ATFL, CFL.  Mild tenderness over the medial malleolus.  Stable to syndesmosis.  Stable to anterior drawer.  No pain with subtalar range of motion.  Patient ambulates without significant antalgia.  Specialty Comments:  No specialty comments available.  Imaging: No results found.   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  Past Surgical History:  Procedure Laterality Date   EYE MUSCLE SURGERY     age 15   HYSTEROSCOPY N/A 06/01/2016   Procedure: EXCISION OF HYMEN;  Surgeon: Fermin Schwab, MD;  Location: [facility];  Service: Gynecology;  Laterality: N/A;   LAPAROSCOPIC APPENDECTOMY N/A 03/24/2015   Procedure: APPENDECTOMY LAPAROSCOPIC;  Surgeon: Abigail Miyamoto, MD;  Location: [facility];  Service: General;  Laterality: N/A;   WISDOM TOOTH EXTRACTION     Social History   Occupational History   Not on file  Tobacco Use   Smoking status: Never   Smokeless tobacco: Not on file  Substance and Sexual Activity   Alcohol use: Not on file   Drug use: Not on file   Sexual activity: Not on file

## 2022-06-22 ENCOUNTER — Encounter: Payer: Self-pay | Admitting: Rehabilitative and Restorative Service Providers"

## 2022-06-22 ENCOUNTER — Ambulatory Visit: Payer: 59 | Admitting: Rehabilitative and Restorative Service Providers"

## 2022-06-22 ENCOUNTER — Other Ambulatory Visit (HOSPITAL_COMMUNITY): Payer: Self-pay | Admitting: *Deleted

## 2022-06-22 ENCOUNTER — Other Ambulatory Visit (HOSPITAL_COMMUNITY): Payer: 59

## 2022-06-22 ENCOUNTER — Other Ambulatory Visit: Payer: Self-pay

## 2022-06-22 DIAGNOSIS — R079 Chest pain, unspecified: Secondary | ICD-10-CM

## 2022-06-22 DIAGNOSIS — M6281 Muscle weakness (generalized): Secondary | ICD-10-CM

## 2022-06-22 DIAGNOSIS — M25571 Pain in right ankle and joints of right foot: Secondary | ICD-10-CM | POA: Diagnosis not present

## 2022-06-22 NOTE — Progress Notes (Signed)
Per Dr Shirlee Latch pt has been having pleuritic chest pain and needs echo and f/u with him this week, order placed, will schedule

## 2022-06-22 NOTE — Therapy (Addendum)
OUTPATIENT PHYSICAL THERAPY EVALUATION /DISCHARGE   Patient Name: Natalie Maldonado MRN: [id number] DOB:May 25, 1996, 26 y.o., female Today's Date: 06/22/2022   PT End of Session - 06/22/22 1143     Visit Number 1    Number of Visits 20    Date for PT Re-Evaluation 08/31/22    Authorization Type FOCUS 20 ded met for family    Progress Note Due on Visit 10    PT Start Time 1145    PT Stop Time 1223    PT Time Calculation (min) 38 min    Activity Tolerance Patient tolerated treatment well    Behavior During Therapy WFL for tasks assessed/performed             Past Medical History:  Diagnosis Date   Arthritis    joint swelling as a result of appenditis   Granuloma of intestine 03/27/2015   Past Surgical History:  Procedure Laterality Date   EYE MUSCLE SURGERY     age 10   HYSTEROSCOPY N/A 06/01/2016   Procedure: EXCISION OF HYMEN;  Surgeon: Governor Specking, MD;  Location: [facility];  Service: Gynecology;  Laterality: N/A;   LAPAROSCOPIC APPENDECTOMY N/A 03/24/2015   Procedure: APPENDECTOMY LAPAROSCOPIC;  Surgeon: Coralie Keens, MD;  Location: [facility];  Service: General;  Laterality: N/A;   [place] EXTRACTION     Patient Active Problem List   Diagnosis Date Noted   Annual physical exam 10/18/2018   Strabismus 10/18/2018   Hyperopia of both eyes with regular astigmatism 01/11/2017   Accommodative esotropia 11/30/2015   Hypertropia of left eye 11/30/2015   Intermittent alternating exotropia 11/30/2015   Other chronic allergic conjunctivitis 11/30/2015   Status post eye surgery 11/30/2015   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   SUBJECTIVE:   SUBJECTIVE STATEMENT: Pt indicated a twist of ankle about 1 week ago while walking.  Pt indicated no pain at the moment but wanted to work on things to improve.  History of a previous injury with bigger pain and symptoms (self treated).    PERTINENT HISTORY: unremarkable  PAIN:  NPRS scale: current 0/10, in injury 3/10 Pain location: Rt foot/ankle Pain description: sharp in moment of injury.  Minimal now Aggravating factors: twisting movement Relieving factors: rest  PRECAUTIONS: None  WEIGHT BEARING RESTRICTIONS No  FALLS:  Has patient fallen in last 6 months? 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  PLOF: Independent , workouts (walks )  PATIENT GOALS   Reduce twisting ankle   OBJECTIVE:   PATIENT SURVEYS:  06/22/2022 FOTO intake:  82   predicted:   88  COGNITION: 06/22/2022 Overall cognitive status: Within functional limits for tasks assessed     SENSATION: 06/22/2022 [facility]  EDEMA:  06/22/2022 unremarkable  POSTURE:  06/22/2022 WFL  PALPATION: 06/22/2022 No specific tenderness  LOWER EXTREMITY ROM:  Active ROM Right 06/22/2022 Left 06/22/2022  Hip flexion    Hip extension    Hip abduction    Hip adduction    Hip internal rotation    Hip external rotation    Knee flexion    Knee extension    Ankle dorsiflexion 8 in 90 deg knee flexion  0 in 0 deg knee flexion 10 in 90 deg knee flexion  2 in 0 deg knee flexion  Ankle plantarflexion [facility]  Ankle inversion [facility]  Ankle eversion WFL WFL   (Blank rows = not tested)  LOWER EXTREMITY MMT:  MMT Right 06/22/2022 Left 06/22/2022  Hip flexion 5/5  5/5  Hip extension 5/5 5/5  Hip abduction 4/5 4/5  Hip adduction    Hip internal rotation    Hip external rotation    Knee flexion    Knee extension    Ankle dorsiflexion 4/5 5/5  Ankle plantarflexion 5/5 (able to perform 20 WB calf raises) - mild pain lateral ankle 5/5 (able to perform 20 WB calf raises)  Ankle inversion 5/5 5/5  Ankle eversion 4/5 5/5   (Blank rows = not tested)  LOWER EXTREMITY SPECIAL TESTS:  06/22/2022 none performed   FUNCTIONAL TESTS:  06/22/2022 Lt SLS: EO 30 sec, EC : 6 seconds Rt SLS:  EO: 30 sec, EC 3 seconds  GAIT: 06/22/2022 Independent ambulation s deviation.   TODAY'S TREATMENT: 06/22/2022: Therex:  HEP instruction/performance c cues for techniques, handout provided.  Trial set performed of each for comprehension and symptom assessment.  See below for exercise list.  Additional time spent in review.    PATIENT EDUCATION:  06/22/2022 Education details: HEP, POC Person educated: Patient Education method: Consulting civil engineer, Demonstration, Verbal cues, and Handouts Education comprehension: verbalized understanding, returned demonstration, and verbal cues required   HOME EXERCISE PROGRAM: Access Code: [email or web address] URL: [email or web address] Date: 06/22/2022 Prepared by: Scot Jun  Exercises - Side Plank on Knees  - 1 x daily - 7 x weekly - 1 sets - 5 reps - to fatigue hold - Modified Side Plank with Hip Abduction  - 1 x daily - 7 x weekly - 3 sets - 10 reps - Long Sitting Ankle Eversion with Resistance  - 1-2 x daily - 7 x weekly - 3 sets - 10-15 reps - Long Sitting Ankle Inversion with Resistance  - 1-2 x daily - 7 x weekly - 3 sets - 10-15 reps - Long Sitting Ankle Dorsiflexion with Anchored Resistance  - 1-2 x daily - 7 x weekly - 3 sets - 10-15 reps - Single Leg Stance  - 1 x daily - 7 x weekly - 1 sets - 3-5 reps - 30 hold - Gastroc Stretch on Wall  - 2 x daily - 7 x weekly - 1 sets - 5 reps - 30 hold  ASSESSMENT:  CLINICAL  IMPRESSION: Patient is a 26 y.o. who comes to clinic with complaints of Rt ankle/foot pain with mobility, strength and movement coordination deficits that impair their ability to perform usual daily and recreational functional activities without increase difficulty/symptoms at this time.  Patient to benefit from skilled PT services to address impairments and limitations to improve to previous level of function without restriction secondary to condition.    OBJECTIVE IMPAIRMENTS decreased activity tolerance, decreased balance, decreased coordination, decreased endurance, decreased mobility, difficulty walking, decreased ROM, decreased strength, impaired perceived functional ability, impaired flexibility, and pain.   ACTIVITY LIMITATIONS squatting, locomotion level, and exercise  PARTICIPATION LIMITATIONS: community activity and  workouts  PERSONAL FACTORS No personal factors affecting patient's functional outcome.   REHAB POTENTIAL: Good  CLINICAL DECISION MAKING: Stable/uncomplicated  EVALUATION COMPLEXITY: Low   GOALS: Goals reviewed with patient? Yes  Short term PT Goals (target date for Short term goals are 3 weeks 07/13/2022) Patient will demonstrate independent use of home exercise program to maintain progress from in clinic treatments. Goal status: New   Long term PT goals (target dates for all long term goals are 10 weeks  08/31/2022 )  1. Patient will demonstrate/report pain at worst less than or equal to 2/10 to facilitate minimal limitation in daily activity secondary to pain symptoms. Goal status: New  2. Patient will demonstrate independent use of home exercise program to facilitate ability to maintain/progress functional gains from skilled physical therapy services. Goal status: New  3. Patient will demonstrate FOTO outcome > or = 88 % to indicate reduced disability due to condition. Goal status: New  4.  Patient will demonstrate Rt LE MMT 5/5 throughout to facilitate  ability to perform usual standing, walking, stairs at PLOF s limitation due to symptoms Goal status: New  5.  Patient will demonstrate Rt ankle AROM DF improved + = 5 degrees in both positioning to facilitate normal mobility at PLOF.    Goal status: New  6.  Patient will demonstrate bilateral SLS eyes closed > 15 seconds unassisted for stability in ambulation.   Goal status: New     PLAN: PT FREQUENCY: 1-2x/week  PT DURATION: 10 weeks if necessary  PLANNED INTERVENTIONS: Therapeutic exercises, Therapeutic activity, Neuro Muscular re-education, Balance training, Gait training, Patient/Family education, Joint mobilization, Stair training, DME instructions, Dry Needling, Electrical stimulation, Cryotherapy, Moist heat, Taping, Ultrasound, Ionotophoresis 22m/ml Dexamethasone, and Manual therapy.  All included unless contraindicated  PLAN FOR NEXT SESSION: Progressive balance improvements.    MScot Jun PT, DPT, OCS, ATC 06/22/22  12:37 PM  PHYSICAL THERAPY DISCHARGE SUMMARY  Visits from Start of Care: 1  Current functional level related to goals / functional outcomes: See note   Remaining deficits: See note   Education / Equipment: HEP  Patient goals were partially met. Patient is being discharged due to not returning since the last visit.  MScot Jun PT, DPT, OCS, ATC 07/25/22  8:50 AM

## 2022-06-23 DIAGNOSIS — E559 Vitamin D deficiency, unspecified: Secondary | ICD-10-CM | POA: Diagnosis not present

## 2022-06-23 DIAGNOSIS — E039 Hypothyroidism, unspecified: Secondary | ICD-10-CM | POA: Diagnosis not present

## 2022-06-23 DIAGNOSIS — E538 Deficiency of other specified B group vitamins: Secondary | ICD-10-CM | POA: Diagnosis not present

## 2022-06-24 ENCOUNTER — Encounter (HOSPITAL_COMMUNITY): Payer: Self-pay | Admitting: Cardiology

## 2022-06-24 ENCOUNTER — Ambulatory Visit (HOSPITAL_BASED_OUTPATIENT_CLINIC_OR_DEPARTMENT_OTHER)
Admission: RE | Admit: 2022-06-24 | Discharge: 2022-06-24 | Disposition: A | Discharge status: Home / Self Care | Payer: 59 | Source: Ambulatory Visit | Attending: Cardiology | Admitting: Cardiology

## 2022-06-24 ENCOUNTER — Ambulatory Visit (HOSPITAL_COMMUNITY)
Admission: RE | Admit: 2022-06-24 | Discharge: 2022-06-24 | Disposition: A | Discharge status: Home / Self Care | Payer: 59 | Source: Ambulatory Visit | Attending: Cardiology | Admitting: Cardiology

## 2022-06-24 VITALS — BP 138/86 | HR 67 | Ht 66.0 in | Wt 196.8 lb

## 2022-06-24 DIAGNOSIS — R079 Chest pain, unspecified: Secondary | ICD-10-CM

## 2022-06-24 DIAGNOSIS — E063 Autoimmune thyroiditis: Secondary | ICD-10-CM | POA: Insufficient documentation

## 2022-06-24 DIAGNOSIS — R0789 Other chest pain: Secondary | ICD-10-CM | POA: Diagnosis not present

## 2022-06-24 LAB — ECHOCARDIOGRAM COMPLETE
AR max vel: 2.23 cm2
AV Area VTI: 2.04 cm2
AV Area mean vel: 2.07 cm2
AV Mean grad: 5 mmHg
AV Peak grad: 9 mmHg
Ao pk vel: 1.5 m/s
Area-P 1/2: 4.15 cm2
Calc EF: 68.4 %
S' Lateral: 3 cm
Single Plane A2C EF: 70.4 %
Single Plane A4C EF: 67.6 %

## 2022-06-24 NOTE — Progress Notes (Signed)
  Echocardiogram 2D Echocardiogram has been performed.  Roosvelt Maser F 06/24/2022, 1:56 PM

## 2022-06-25 NOTE — Progress Notes (Signed)
PCP: Aggie Hacker, MD Cardiology: Dr. Shirlee Latch  26 y.o. with history of Hashimoto's thyroiditis and undifferentiated inflammatory arthritis presents for evaluation of chest pain. Patient's only medications are Levoxyl for hyperthyroidism and sulfasalazine for the undifferentiated inflammatory arthritis.  She is followed by Dr. Dierdre Forth for rheumatology.  She is currently a Psychologist, occupational at [employer]. In June, patient was on a family vacation in [place].  One day, just before dinner, she developed central chest tightness possibly with pleuritic component.  Pain lasted 2-3 hours then resolved.  She had not eaten anything when it started, and it did not feel like prior reflux. She thinks she may have had a "cold" at the time. About a week ago, she developed chest pain again.  This was different, she had been working out in the gym for the first time in a while. The pain was constant for 2 days and was aggravated by arm movements and twisting her torso.  Since then, no further chest pain.  She has had no palpitations or lightheadedness.   ECG (personally reviewed): NSR, normal  Labs (4/23): LDL 133  PMH: 1. Hashimoto's thyroiditis 2. Chronic joint pain: Suspect inflammatory arthritis, but definite diagnosis has not been made (sees Dr. Dierdre Forth).  3. Chest pain: Echo (7/23) with EF 60-65%, normal study.   SH: Psychologist, occupational at [employer].  No ETOH/smoking.    FH: No history of VTE, grandparents with CAD (no premature CAD).   ROS: All systems reviewed and negative except as per HPI.   Current Outpatient Medications  Medication Sig Dispense Refill   levothyroxine (SYNTHROID) 112 MCG tablet Take 112 mcg by mouth daily.     No current facility-administered medications for this encounter.   BP 138/86   Pulse 67   Ht 5\' 6"  (1.676 m)   Wt 89.3 kg (196 lb 12.8 oz)   SpO2 98%   BMI 31.76 kg/m  General: NAD Neck: No JVD, no thyromegaly or thyroid nodule.  Lungs: Clear to auscultation bilaterally with normal  respiratory effort. CV: Nondisplaced PMI.  Heart regular S1/S2, no S3/S4, no murmur.  No peripheral edema.  No carotid bruit.  Normal pedal pulses.  Abdomen: Soft, nontender, no hepatosplenomegaly, no distention.  Skin: Intact without lesions or rashes.  Neurologic: Alert and oriented x 3.  Psych: Normal affect. Extremities: No clubbing or cyanosis.  HEENT: Normal.   Assessment/Plan: 1. Chest pain: Patient had 2 episodes recently.  The second episode about a week ago was likely musculoskeletal and related to muscle strain.  However, the initial episode is less certain.  There was a pleuritic component but it only lasted about 2-3 hours.  PE is unlikely with relatively quick resolution.  CAD is unlikely => pleuritic pain, no FH of premature CAD.  I would consider pericarditis, especially as she had a viral-type illness at the time (cold with congestion). She also has an undifferentiated inflammatory arthritis that could possibly be related to pericarditis.  However, would have expected pericarditis to last longer.  She does not think it felt like GERD.  Echo and ECG were done today and were normal.  - If she has another episode, I recommended that she try to get an ECG done and also get HR checked.  If she has a recurrence of symptoms suggestive of acute pericarditis, she may need treatment for this (colchicine/NSAIDs).   2. Hashimoto's thyroiditis: She is on Levoxyl, this is followed closely.   06/25/2022

## 2022-06-30 ENCOUNTER — Encounter: Payer: 59 | Admitting: Rehabilitative and Restorative Service Providers"

## 2022-07-01 ENCOUNTER — Telehealth: Payer: Self-pay | Admitting: Surgical

## 2022-07-01 ENCOUNTER — Other Ambulatory Visit: Payer: Self-pay

## 2022-07-01 DIAGNOSIS — M25571 Pain in right ankle and joints of right foot: Secondary | ICD-10-CM

## 2022-07-01 DIAGNOSIS — M25562 Pain in left knee: Secondary | ICD-10-CM

## 2022-07-01 NOTE — Telephone Encounter (Signed)
Patient is a 26 year old female who has history of transient inflammatory arthropathy of multiple joints.  This arthropathy began several years ago in the fourth MTP joint of the right foot.  This fourth toe was helped with injection but recently pain has begun to significantly limit her.  He gets in the way of her walking and she ambulates with a limp.  She is a very active individual and enjoys going to the gym and going for hikes.  She also has been going to physical therapy for recent ankle injury for her foot and ankle problems with no relief of her symptoms in the right forefoot.  Her other complaint is a left knee injury that she sustained while hiking in [place].  This happened about 2 months ago.  She had significant increase in left knee pain that made it difficult to complete the hike after her injury.  She has had very slight improvement over the last 2 months but overall it is still causing her significant distress.  She has no instability.  No effusion.  She does feel a clicking sensation in the knee at times but her main complaint is pain.  No groin pain.  She has been doing exercises over the last 2 months with a PT directed home exercise program.  With lack of significant sustained improvement in both the right forefoot and the left knee, plan to order right foot MRI and left knee MRI for further evaluation.  Want to evaluate the fourth MTP joint with comparison to prior MRI scan from several years ago.  Also want to evaluate the left knee for potential meniscal pathology.

## 2022-07-05 DIAGNOSIS — M792 Neuralgia and neuritis, unspecified: Secondary | ICD-10-CM | POA: Diagnosis not present

## 2022-07-05 DIAGNOSIS — G5781 Other specified mononeuropathies of right lower limb: Secondary | ICD-10-CM | POA: Diagnosis not present

## 2022-07-05 DIAGNOSIS — M21621 Bunionette of right foot: Secondary | ICD-10-CM | POA: Diagnosis not present

## 2022-07-07 DIAGNOSIS — G5781 Other specified mononeuropathies of right lower limb: Secondary | ICD-10-CM | POA: Diagnosis not present

## 2022-07-07 DIAGNOSIS — M21621 Bunionette of right foot: Secondary | ICD-10-CM | POA: Diagnosis not present

## 2022-07-16 ENCOUNTER — Inpatient Hospital Stay: Admission: RE | Admit: 2022-07-16 | Payer: 59 | Source: Ambulatory Visit

## 2022-07-16 ENCOUNTER — Other Ambulatory Visit: Payer: 59

## 2022-08-05 DIAGNOSIS — G5781 Other specified mononeuropathies of right lower limb: Secondary | ICD-10-CM | POA: Diagnosis not present

## 2022-08-05 DIAGNOSIS — M21621 Bunionette of right foot: Secondary | ICD-10-CM | POA: Diagnosis not present

## 2022-08-05 DIAGNOSIS — M792 Neuralgia and neuritis, unspecified: Secondary | ICD-10-CM | POA: Diagnosis not present

## 2022-08-25 DIAGNOSIS — E559 Vitamin D deficiency, unspecified: Secondary | ICD-10-CM | POA: Diagnosis not present

## 2022-08-25 DIAGNOSIS — M359 Systemic involvement of connective tissue, unspecified: Secondary | ICD-10-CM | POA: Diagnosis not present

## 2022-08-25 DIAGNOSIS — E039 Hypothyroidism, unspecified: Secondary | ICD-10-CM | POA: Diagnosis not present

## 2022-08-25 DIAGNOSIS — R5383 Other fatigue: Secondary | ICD-10-CM | POA: Diagnosis not present

## 2022-08-25 DIAGNOSIS — R635 Abnormal weight gain: Secondary | ICD-10-CM | POA: Diagnosis not present

## 2022-08-25 DIAGNOSIS — E538 Deficiency of other specified B group vitamins: Secondary | ICD-10-CM | POA: Diagnosis not present

## 2022-08-29 DIAGNOSIS — Z0184 Encounter for antibody response examination: Secondary | ICD-10-CM | POA: Diagnosis not present

## 2022-09-03 DIAGNOSIS — E039 Hypothyroidism, unspecified: Secondary | ICD-10-CM | POA: Diagnosis not present

## 2022-11-02 ENCOUNTER — Other Ambulatory Visit (HOSPITAL_COMMUNITY): Payer: Self-pay

## 2022-11-02 ENCOUNTER — Other Ambulatory Visit: Payer: Self-pay | Admitting: Surgical

## 2022-11-02 MED ORDER — SULFASALAZINE 500 MG PO TABS
500.0000 mg | ORAL_TABLET | Freq: Two times a day (BID) | ORAL | 0 refills | Status: AC
  Filled 2022-11-02: qty 90, 45d supply, fill #0

## 2022-11-24 DIAGNOSIS — E039 Hypothyroidism, unspecified: Secondary | ICD-10-CM | POA: Diagnosis not present

## 2022-12-02 DIAGNOSIS — M25552 Pain in left hip: Secondary | ICD-10-CM | POA: Diagnosis not present

## 2022-12-02 DIAGNOSIS — Z6830 Body mass index (BMI) 30.0-30.9, adult: Secondary | ICD-10-CM | POA: Diagnosis not present

## 2022-12-02 DIAGNOSIS — M064 Inflammatory polyarthropathy: Secondary | ICD-10-CM | POA: Diagnosis not present

## 2022-12-02 DIAGNOSIS — E669 Obesity, unspecified: Secondary | ICD-10-CM | POA: Diagnosis not present

## 2022-12-12 ENCOUNTER — Telehealth: Payer: Self-pay | Admitting: Surgical

## 2022-12-12 NOTE — Telephone Encounter (Signed)
Novella is a 27 year old female who has a history of inflammatory arthritis who presents to the clinic after hours complaining of of right groin pain.  She has had pain for the last couple days with no history of injury.  She is able to bear weight and walk around but she does have fair amount of discomfort.  She has had prior injections into her right hip joint with great resolution of her symptoms in the past.  She denies any fevers or chills.  Ultrasound probe was applied to the anterior aspect of the hip; effusion of the right hip was noted with anechoic fluid distending the joint capsule.  No other findings noted on ultrasound exam.  After discussion of options, patient would like to proceed with ultrasound-guided aspiration and injection of the right hip joint.  Right hip was prepped and draped in a sterile manner and under ultrasound guidance, 22-gauge spinal needle was used for administration of lidocaine.  5 to 10 minutes later, a new 22-gauge spinal needle was inserted into the right hip joint by myself with aspiration of 4-5 cc of nonpurulent synovial fluid followed by injection of combination of 4 cc bupivacaine with 1 cc Depo-Medrol with intra-articular injection confirmed by ultrasound imaging.  She tolerated procedure well.  She will follow-up as needed and reach out with any concerns.

## 2022-12-20 ENCOUNTER — Encounter: Payer: Self-pay | Admitting: Sports Medicine

## 2022-12-20 ENCOUNTER — Ambulatory Visit
Admission: RE | Admit: 2022-12-20 | Discharge: 2022-12-20 | Disposition: A | Discharge status: Home / Self Care | Payer: Commercial Managed Care - PPO | Source: Ambulatory Visit | Attending: Sports Medicine | Admitting: Sports Medicine

## 2022-12-20 ENCOUNTER — Ambulatory Visit (INDEPENDENT_AMBULATORY_CARE_PROVIDER_SITE_OTHER): Payer: Commercial Managed Care - PPO | Admitting: Sports Medicine

## 2022-12-20 ENCOUNTER — Other Ambulatory Visit: Payer: Self-pay | Admitting: Sports Medicine

## 2022-12-20 DIAGNOSIS — M25572 Pain in left ankle and joints of left foot: Secondary | ICD-10-CM

## 2022-12-20 DIAGNOSIS — M19072 Primary osteoarthritis, left ankle and foot: Secondary | ICD-10-CM | POA: Diagnosis not present

## 2022-12-20 DIAGNOSIS — W1839XA Other fall on same level, initial encounter: Secondary | ICD-10-CM | POA: Diagnosis not present

## 2022-12-20 DIAGNOSIS — S99912A Unspecified injury of left ankle, initial encounter: Secondary | ICD-10-CM

## 2022-12-20 DIAGNOSIS — R030 Elevated blood-pressure reading, without diagnosis of hypertension: Secondary | ICD-10-CM | POA: Diagnosis not present

## 2022-12-20 DIAGNOSIS — Y9301 Activity, walking, marching and hiking: Secondary | ICD-10-CM | POA: Diagnosis not present

## 2022-12-20 NOTE — Progress Notes (Signed)
Natalie Maldonado - 27 y.o. female MRN [id number]  Date of birth: July 08, 1996  Office Visit Note: Visit Date: 12/20/2022 PCP: Monna Fam, MD Referred by: Monna Fam, MD  Subjective: Chief Complaint  Patient presents with   Left Ankle - Pain   HPI: Natalie Maldonado is a pleasant 27 y.o. female who presents today for evaluation of left ankle injury.  Yesterday she was dancing as part of a class when she was doing/going to the ankle and very give out on her, she did fall to the ground.  She was able to rejoin some of the activities but her pain has persisted.  She is having pain with ambulation.  She has a history of ankle sprains, her other recent ones were the contralateral leg.  However, she has never had medial sided ankle pain.  She points to the anterior medial ankle joint.  Currently wearing ASO ankle brace and performing RICE therapy.   Was seen at [facility] urgent care earlier today and did have a three-view x-ray report without any evidence of fracture or dislocation.  I did look at a picture of this x-ray on her phone and there is a subtle cortical finding near the medial aspect of the neck of the talus, however no clear fracture was visualized on my read.  Pertinent ROS were reviewed with the patient and found to be negative unless otherwise specified above in HPI.   Assessment & Plan: Visit Diagnoses:  1. Left ankle injury, initial encounter    Plan: Discussed with Natalie Maldonado and her mother possible diagnosis of this with her ankle injury.  My suspicion is that this is more of a ligamental injury (tibiotalar) given the exam, but with the inconclusive cortical defect over the talar neck on previous x-ray, I do think it is prudent to rule out occult fracture with a CT scan of the left ankle.  This was ordered today, she will go over to [facility] imaging to have this completed.  Will keep her in the lace up ASO ankle brace until that time as well as RICE therapy.  I did print out and go through ankle  rehab exercises for her to perform her treatment and then phase 2 of prevention for both ankles going forward.  She will wait to start these until her CT scan comes back to rule out fracture.   Follow-up: PRN   Meds & Orders: No orders of the defined types were placed in this encounter.  No orders of the defined types were placed in this encounter.   - CT scan ankle left, w/o contrast ordered today  Procedures: No procedures performed      Clinical History: No specialty comments available.  She reports that she has never smoked. She does not have any smokeless tobacco history on file. No results for input(s): "HGBA1C", "LABURIC" in the last 8760 hours.  Objective:    Physical Exam  Gen: Well-appearing, in no acute distress; non-toxic CV: Regular Rate. Well-perfused. Warm.  Resp: Breathing unlabored on room air; no wheezing. Psych: Fluid speech in conversation; appropriate affect; normal thought process Neuro: Sensation intact throughout. No gross coordination deficits.   Ortho Exam - Left ankle: No visible erythema, bruising or effusion about the ankle.  There is positive TTP over the anterior ankle joint and the medial aspect of the tibiotalar region.  There is 1+ laxity with anterior drawer testing although this is equivocal to the contralateral ankle.  Negative inversion/eversion stress testing.  There is pain with syndesmotic  squeeze, negative Klieger's test however with good stability upon external rotation.  Mildly antalgic gait.  Imaging:  *Outside x-ray from [facility] - report as close no acute fracture or dislocation.  On my view of the picture there is a cortical defect near the medial neck of the talus, inconclusive if fracture or benign pathology.  Past Medical/Family/Surgical/Social History: Medications & Allergies reviewed per EMR, new medications updated. Patient Active Problem List   Diagnosis Date Noted   Annual physical exam 10/18/2018   Strabismus 10/18/2018    Hyperopia of both eyes with regular astigmatism 01/11/2017   Accommodative esotropia 11/30/2015   Hypertropia of left eye 11/30/2015   Intermittent alternating exotropia 11/30/2015   Other chronic allergic conjunctivitis 11/30/2015   Status post eye surgery 11/30/2015   Strabismic amblyopia of left eye 11/30/2015   History of appendicitis 05/25/2015   Granuloma of intestine 03/27/2015   Inflammatory arthritis    FUO (fever of unknown origin)    Postoperative fever    Perforated appendicitis 03/23/2015   Transient arthropathy 02/09/2015   Past Medical History:  Diagnosis Date   Arthritis    joint swelling as a result of appenditis   Granuloma of intestine 03/27/2015   No family history on file. Past Surgical History:  Procedure Laterality Date   EYE MUSCLE SURGERY     age 80   HYSTEROSCOPY N/A 06/01/2016   Procedure: EXCISION OF HYMEN;  Surgeon: Fermin Schwab, MD;  Location: [facility];  Service: Gynecology;  Laterality: N/A;   LAPAROSCOPIC APPENDECTOMY N/A 03/24/2015   Procedure: APPENDECTOMY LAPAROSCOPIC;  Surgeon: Abigail Miyamoto, MD;  Location: [facility];  Service: General;  Laterality: N/A;   WISDOM TOOTH EXTRACTION     Social History   Occupational History   Not on file  Tobacco Use   Smoking status: Never   Smokeless tobacco: Not on file  Substance and Sexual Activity   Alcohol use: Not on file   Drug use: Not on file   Sexual activity: Not on file

## 2023-02-28 DIAGNOSIS — E039 Hypothyroidism, unspecified: Secondary | ICD-10-CM | POA: Diagnosis not present

## 2023-06-13 ENCOUNTER — Other Ambulatory Visit (HOSPITAL_COMMUNITY): Payer: Self-pay

## 2023-06-13 ENCOUNTER — Encounter (HOSPITAL_COMMUNITY): Payer: Self-pay

## 2023-06-13 DIAGNOSIS — L308 Other specified dermatitis: Secondary | ICD-10-CM | POA: Diagnosis not present

## 2023-06-13 DIAGNOSIS — L089 Local infection of the skin and subcutaneous tissue, unspecified: Secondary | ICD-10-CM | POA: Diagnosis not present

## 2023-06-13 MED ORDER — DESONIDE 0.05 % EX OINT
1.0000 | TOPICAL_OINTMENT | Freq: Two times a day (BID) | CUTANEOUS | 0 refills | Status: AC
  Filled 2023-06-13: qty 60, 30d supply, fill #0

## 2023-06-13 MED ORDER — CEPHALEXIN 500 MG PO CAPS
500.0000 mg | ORAL_CAPSULE | Freq: Two times a day (BID) | ORAL | 0 refills | Status: AC
  Filled 2023-06-13: qty 20, 10d supply, fill #0

## 2023-06-14 ENCOUNTER — Other Ambulatory Visit (HOSPITAL_COMMUNITY): Payer: Self-pay

## 2023-06-14 ENCOUNTER — Other Ambulatory Visit: Payer: Self-pay

## 2023-06-19 DIAGNOSIS — L309 Dermatitis, unspecified: Secondary | ICD-10-CM | POA: Diagnosis not present

## 2023-06-26 ENCOUNTER — Other Ambulatory Visit (HOSPITAL_COMMUNITY): Payer: Self-pay

## 2023-07-21 DIAGNOSIS — E538 Deficiency of other specified B group vitamins: Secondary | ICD-10-CM | POA: Diagnosis not present

## 2023-07-21 DIAGNOSIS — E559 Vitamin D deficiency, unspecified: Secondary | ICD-10-CM | POA: Diagnosis not present

## 2023-07-21 DIAGNOSIS — Z0000 Encounter for general adult medical examination without abnormal findings: Secondary | ICD-10-CM | POA: Diagnosis not present

## 2023-07-21 DIAGNOSIS — E039 Hypothyroidism, unspecified: Secondary | ICD-10-CM | POA: Diagnosis not present

## 2023-07-27 DIAGNOSIS — E559 Vitamin D deficiency, unspecified: Secondary | ICD-10-CM | POA: Diagnosis not present

## 2023-07-27 DIAGNOSIS — R635 Abnormal weight gain: Secondary | ICD-10-CM | POA: Diagnosis not present

## 2023-07-27 DIAGNOSIS — E538 Deficiency of other specified B group vitamins: Secondary | ICD-10-CM | POA: Diagnosis not present

## 2023-07-27 DIAGNOSIS — H509 Unspecified strabismus: Secondary | ICD-10-CM | POA: Diagnosis not present

## 2023-07-27 DIAGNOSIS — M359 Systemic involvement of connective tissue, unspecified: Secondary | ICD-10-CM | POA: Diagnosis not present

## 2023-07-27 DIAGNOSIS — Z0000 Encounter for general adult medical examination without abnormal findings: Secondary | ICD-10-CM | POA: Diagnosis not present

## 2023-07-27 DIAGNOSIS — M1990 Unspecified osteoarthritis, unspecified site: Secondary | ICD-10-CM | POA: Diagnosis not present

## 2023-07-27 DIAGNOSIS — E039 Hypothyroidism, unspecified: Secondary | ICD-10-CM | POA: Diagnosis not present

## 2023-07-27 DIAGNOSIS — Z8719 Personal history of other diseases of the digestive system: Secondary | ICD-10-CM | POA: Diagnosis not present

## 2023-07-27 DIAGNOSIS — Z1331 Encounter for screening for depression: Secondary | ICD-10-CM | POA: Diagnosis not present

## 2023-11-21 DIAGNOSIS — Z9889 Other specified postprocedural states: Secondary | ICD-10-CM | POA: Diagnosis not present

## 2023-11-21 DIAGNOSIS — H5043 Accommodative component in esotropia: Secondary | ICD-10-CM | POA: Diagnosis not present

## 2024-01-30 DIAGNOSIS — E039 Hypothyroidism, unspecified: Secondary | ICD-10-CM | POA: Diagnosis not present
